# Patient Record
Sex: Male | Born: 1995 | Race: White | Hispanic: No | Marital: Single | Smoking: Current every day smoker
Health system: Southern US, Community
[De-identification: ages and names within clinical notes are randomized; demographics above are authoritative.]

## PROBLEM LIST (undated history)

## (undated) DIAGNOSIS — E119 Type 2 diabetes mellitus without complications: Secondary | ICD-10-CM

---
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
## 2019-01-12 ENCOUNTER — Other Ambulatory Visit: Payer: Self-pay

## 2019-01-12 ENCOUNTER — Emergency Department
Admission: EM | Admit: 2019-01-12 | Discharge: 2019-01-12 | Disposition: A | Discharge status: Home / Self Care | Payer: Self-pay | Attending: Emergency Medicine | Admitting: Emergency Medicine

## 2019-01-12 ENCOUNTER — Encounter: Payer: Self-pay | Admitting: Emergency Medicine

## 2019-01-12 DIAGNOSIS — L03311 Cellulitis of abdominal wall: Secondary | ICD-10-CM | POA: Insufficient documentation

## 2019-01-12 DIAGNOSIS — E109 Type 1 diabetes mellitus without complications: Secondary | ICD-10-CM | POA: Insufficient documentation

## 2019-01-12 HISTORY — DX: Type 2 diabetes mellitus without complications: E11.9

## 2019-01-12 MED ORDER — DOXYCYCLINE HYCLATE 100 MG PO CAPS: 100.0000 mg | ORAL_CAPSULE | Freq: Two times a day (BID) | ORAL | 0 refills | Status: AC

## 2019-01-12 MED ORDER — CEPHALEXIN 500 MG PO CAPS: 500.0000 mg | ORAL_CAPSULE | Freq: Three times a day (TID) | ORAL | 0 refills | Status: AC

## 2019-01-12 NOTE — ED Notes (Signed)
Assessment of abd and rash completed by Dr. Darnelle Catalan

## 2019-01-12 NOTE — ED Triage Notes (Signed)
Here for swelling to umbilical area for 5 days. + redness and pain per pt. No fevers. Has not lifted anything heavy. Appears like abscess.

## 2019-01-12 NOTE — ED Provider Notes (Signed)
[facility] Emergency Department Provider Note   ____________________________________________   First MD Initiated Contact with Patient 01/12/19 [phone]     (approximate)  I have reviewed the triage vital signs and the nursing notes.   HISTORY  Chief Complaint swelling around umbilical  HPI Patrick Castro is a 23 y.o. male patient complains of a red swollen area at the belt line just to the right and below the umbilicus.  Says it started out as a hair bump and got worse.  It is now red about the size of a quarter and tender.  Somewhat swollen.  Patient is a type I diabetic.  His sugars been running 200 which is high for him.         Past Medical History:  Diagnosis Date  . Diabetes mellitus without complication (HCC)     There are no active problems to display for this patient.   History reviewed. No pertinent surgical history.  Prior to Admission medications   Medication Sig Start Date End Date Taking? Authorizing Provider  cephALEXin (KEFLEX) 500 MG capsule Take 1 capsule (500 mg total) by mouth 3 (three) times daily for 10 days. 01/12/19 01/22/19  Arnaldo Natal, MD  doxycycline (VIBRAMYCIN) 100 MG capsule Take 1 capsule (100 mg total) by mouth 2 (two) times daily. 01/12/19   Arnaldo Natal, MD    Allergies Patient has no known allergies.  History reviewed. No pertinent family history.  Social History Social History   Tobacco Use  . Smoking status: Never Smoker  . Smokeless tobacco: Never Used  Substance Use Topics  . Alcohol use: Yes    Comment: rare  . Drug use: Never    Review of Systems  Constitutional: No fever/chills Eyes: No visual changes. ENT: No sore throat. Cardiovascular: Denies chest pain. Respiratory: Denies shortness of breath. Gastrointestinal: No abdominal pain.  No nausea, no vomiting.  No diarrhea.  No constipation. Genitourinary: Negative for dysuria. Musculoskeletal: Negative for back pain. Skin:  Negative for rash. Neurological: Negative for headaches, focal weakness   ____________________________________________   PHYSICAL EXAM:  VITAL SIGNS: ED Triage Vitals  Enc Vitals Group     BP 01/12/19 0752 (!) 138/96     Pulse Rate 01/12/19 0752 87     Resp --      Temp 01/12/19 0753 98.1 F (36.7 C)     Temp Source 01/12/19 0753 Oral     SpO2 01/12/19 0752 99 %     Weight 01/12/19 0748 190 lb (86.2 kg)     Height 01/12/19 0748 6\' 1"  (1.854 m)     Head Circumference --      Peak Flow --      Pain Score 01/12/19 0748 5     Pain Loc --      Pain Edu? --      Excl. in [place]? --     Constitutional: Alert and oriented. Well appearing and in no acute distress. Eyes: Conjunctivae are normal. Head: Atraumatic. Nose: No congestion/rhinnorhea. Mouth/Throat: Mucous membranes are moist.  Oropharynx non-erythematous. Neck: No stridor. Cardiovascular: Normal rate, regular rhythm. Grossly normal heart sounds.  Good peripheral circulation. Respiratory: Normal respiratory effort.  No retractions. Lungs CTAB. Gastrointestinal: Soft and nontender. No distention. No abdominal bruits. No CVA tenderness. Musculoskeletal: No lower extremity tenderness nor edema.  s. Neurologic:  Normal speech and language. No gross focal neurologic deficits are appreciated. No gait instability. Skin:  Skin is warm, dry and intact. No rash  noted except as described in HPI.. Psychiatric: Mood and affecMarland Kitchent are normal. Speech and behavior are normal.  ____________________________________________   LABS (all labs ordered are listed, but only abnormal results are displayed)  Labs Reviewed - No data to display ____________________________________________  EKG   ____________________________________________  RADIOLOGY  ED MD interpretation: Bedside ultrasound done of the red area with high-frequency vascular probe.  No pus is seen on compression of the area.  Official radiology report(s): No results found.   ____________________________________________   PROCEDURES  Procedure(s) performed (including Critical Care):  Procedures   ____________________________________________   INITIAL IMPRESSION / ASSESSMENT AND PLAN / ED COURSE  This appears to be area of cellulitis and/or phlegmon but not abscess at this point.  Will treat with Keflex and doxycycline to try and get this under control and avoid any resistance if it is a staph infection.  Patient will return if worse or no better in 2 or 3 days.              ____________________________________________   FINAL CLINICAL IMPRESSION(S) / ED DIAGNOSES  Final diagnoses:  Cellulitis of abdominal wall     ED Discharge Orders         Ordered    doxycycline (VIBRAMYCIN) 100 MG capsule  2 times daily     01/12/19 0821    cephALEXin (KEFLEX) 500 MG capsule  3 times daily     01/12/19 [id number]           Note:  This document was prepared using Dragon voice recognition software and may include unintentional dictation errors.    Arnaldo NatalMalinda, Quinne Pires F, MD 01/12/19 [phone]

## 2019-01-12 NOTE — Discharge Instructions (Addendum)
You have a little skin infection.  Sometimes these are caused by resistant bacteria.  Because of this I will give you 2 different antibiotics one is Keflex take that 3 times a day.  The otherwise doxycycline which you take twice a day.  Please return if this gets worse including swells up larger gets red or bigger or more painful or if it is not any better in 2 or 3 days.  It is still possible it may develop an abscess that we need to drain but right now there does not appear to be one there.  Monitor your sugars carefully.  They should improve as the infection improves.

## 2019-08-24 ENCOUNTER — Encounter: Payer: Self-pay | Admitting: Intensive Care

## 2019-08-24 ENCOUNTER — Other Ambulatory Visit: Payer: Self-pay

## 2019-08-24 ENCOUNTER — Emergency Department: Payer: Self-pay

## 2019-08-24 ENCOUNTER — Emergency Department
Admission: EM | Admit: 2019-08-24 | Discharge: 2019-08-24 | Disposition: A | Discharge status: Home / Self Care | Payer: Self-pay | Attending: Emergency Medicine | Admitting: Emergency Medicine

## 2019-08-24 DIAGNOSIS — M79661 Pain in right lower leg: Secondary | ICD-10-CM

## 2019-08-24 DIAGNOSIS — F17210 Nicotine dependence, cigarettes, uncomplicated: Secondary | ICD-10-CM | POA: Insufficient documentation

## 2019-08-24 DIAGNOSIS — E119 Type 2 diabetes mellitus without complications: Secondary | ICD-10-CM | POA: Insufficient documentation

## 2019-08-24 DIAGNOSIS — I82451 Acute embolism and thrombosis of right peroneal vein: Secondary | ICD-10-CM | POA: Insufficient documentation

## 2019-08-24 DIAGNOSIS — F17290 Nicotine dependence, other tobacco product, uncomplicated: Secondary | ICD-10-CM | POA: Insufficient documentation

## 2019-08-24 MED ORDER — APIXABAN 5 MG PO TABS: 10.0000 mg | ORAL_TABLET | Freq: Two times a day (BID) | ORAL | 0 refills | Status: AC

## 2019-08-24 MED ORDER — APIXABAN 5 MG PO TABS
10.0000 mg | ORAL_TABLET | Freq: Once | ORAL | Status: AC
  Administered 2019-08-24: 15:00:00 10 mg via ORAL
  Filled 2019-08-24: qty 2

## 2019-08-24 NOTE — ED Notes (Signed)
See triage note  Presents with pain to right heel and it radiates into lateral aspect of lower leg  Denies any injury   No swelling

## 2019-08-24 NOTE — ED Triage Notes (Signed)
Reports pain in his right calf X3-4 days. Denies injury. Able to ambulate with extreme pain

## 2019-08-24 NOTE — ED Notes (Signed)
Pt states he feels safe driving himself home, pt encouraged to call ride due to leg, pt refused. Pt steady on crutches.

## 2019-08-24 NOTE — ED Notes (Signed)
Pt given crutches and instructed on use. Pt discharged to lobby in wheelchair

## 2019-08-24 NOTE — ED Provider Notes (Signed)
[facility] Provider Note ____________________________________________  Time seen: 1140  I have reviewed the triage vital signs and the nursing notes.  HISTORY  Chief Complaint  Leg Pain (right calf)   HPI Patrick Castro Patrick Castro is a 23 y.o. male presents to the ER today with complaint of right lower leg pain.  He reports this started abruptly 4 to 5 days ago while sitting in class.  He describes the pain as throbbing at rest or when lying in the bed.  The pain is sharp with ambulation.  He has not noticed any redness or swelling of the right lower extremity.  He denies any injury to the area.  He reports his mother has a history of blood clots but she also had cancer so they were felt to be provoked.  He has taken buprofen OTC with minimal relief.  Past Medical History:  Diagnosis Date  . Diabetes mellitus without complication ([place])     There are no active problems to display for this patient.   History reviewed. No pertinent surgical history.  Prior to Admission medications   Medication Sig Start Date End Date Taking? Authorizing Provider  apixaban (ELIQUIS) 5 MG TABS tablet Take 2 tablets (10 mg total) by mouth 2 (two) times daily. 08/24/19   Jearld Fenton, NP  doxycycline (VIBRAMYCIN) 100 MG capsule Take 1 capsule (100 mg total) by mouth 2 (two) times daily. 01/12/19   Nena Polio, MD    Allergies Patient has no known allergies.  History reviewed. No pertinent family history.  Social History Social History   Tobacco Use  . Smoking status: Current Every Day Smoker    Types: E-cigarettes  . Smokeless tobacco: Never Used  Substance Use Topics  . Alcohol use: Yes    Comment: rare  . Drug use: Never    Review of Systems  Constitutional: Negative for fever, chills or body aches. Cardiovascular: Negative for chest pain or chest tightness. Respiratory: Negative for cough or shortness of breath. Musculoskeletal:  Positive for right lower extremity pain Skin: Negative for redness, warmth or swelling. Neurological: Negative for focal weakness, tingling or numbness. ____________________________________________  PHYSICAL EXAM:  VITAL SIGNS: ED Triage Vitals  Enc Vitals Group     BP 08/24/19 1113 133/89     Pulse Rate 08/24/19 1113 (!) 115     Resp 08/24/19 1113 14     Temp 08/24/19 1113 99.5 F (37.5 C)     Temp Source 08/24/19 1113 Oral     SpO2 08/24/19 1113 96 %     Weight 08/24/19 1111 195 lb (88.5 kg)     Height 08/24/19 1111 6\' 1"  (1.854 m)     Head Circumference --      Peak Flow --      Pain Score 08/24/19 1110 2     Pain Loc --      Pain Edu? --      Excl. in [place]? --     Constitutional: Alert and oriented. Well appearing and in no distress. Cardiovascular: Normal rate, regular rhythm.  Pedal pulses 2+ bilaterally.  Negative Homans. Respiratory: Normal respiratory effort. No wheezes/rales/rhonchi. Musculoskeletal: Normal flexion and extension of the right knee.  No pain with palpation of the knee.  Normal dorsiflexion, plantar flexion and rotation of the right ankle.  No pain with palpation of the right ankle.  Pain with palpation over the right fibula.  No right lower extremity swelling noted.  Gait not visualized. Neurologic:  Normal speech and language. No gross focal neurologic deficits are appreciated. Skin:  Skin is warm, dry and intact. No redness, warmth or swelling noted.  ____________________________________________   RADIOLOGY  Imaging Orders     DG Tibia/Fibula Right     US Venous Img Lower Unilateral Right   IMPRESSION:  Negative exam.   IMPRESSION:  1. POSITIVE for isolated right calf (peroneal) DVT.    ____________________________________________   INITIAL IMPRESSION / ASSESSMENT AND PLAN / ED COURSE  Right Lower Extremity Pain:   Xray right tib/fib negative for fracture Venous U/S RLE positive for DVT Eliquis 10 mg PO x 1 in ER RX for Eliquis 10 mg  PO x 7 days Recommend follow up with [facility] Castro on Monday Recommend follow up with hematology as outpatient  ____________________________________________  FINAL CLINICAL IMPRESSION(S) / ED DIAGNOSES  Final diagnoses:  Acute deep vein thrombosis (DVT) of right peroneal vein Patrick [facility])   Nicki Reaper, NP    Lorre Munroe, NP 08/24/19 1459    Concha Se, MD 08/26/19 [phone]

## 2019-08-24 NOTE — Discharge Instructions (Addendum)
You were seen today for acute right lower leg pain.  Your x-ray was negative.  Your ultrasound was positive for peroneal DVT (blood clot).  We have given you your first dose of anticoagulation today in the ER.  I have given you prescription for your anticoagulation through Monday evening.  Please take 2 tabs in the morning and 2 tabs in the evening until you follow-up with Princella Ion on Monday.  Monitor for signs and symptoms of bleeding.  Your PCP will set you up with a hematologist.

## 2019-10-09 ENCOUNTER — Ambulatory Visit: Payer: Self-pay | Attending: Internal Medicine

## 2019-10-09 DIAGNOSIS — Z20822 Contact with and (suspected) exposure to covid-19: Secondary | ICD-10-CM | POA: Insufficient documentation

## 2019-10-10 LAB — NOVEL CORONAVIRUS, NAA: SARS-CoV-2, NAA: NOT DETECTED

## 2019-12-20 ENCOUNTER — Telehealth: Payer: Self-pay | Admitting: Pharmacy Technician

## 2019-12-20 NOTE — Telephone Encounter (Signed)
Patient failed to provide 2021 proof of income.  No additional medication assistance will be provided by [facility] without the required proof of income documentation.  Patient notified by letter.  [facility] Care Manager [facility]   [address] [place], [place]  [address]     This is to inform you that you are no longer eligible to receive medication assistance at [facility].  The reason(s) are:    _____Your total gross monthly household income exceeds 250% of the Federal Poverty Level.   _____Tangible assets (savings, checking, stocks/bonds, pension, retirement, etc.) exceeds our limit  _____You are eligible to receive benefits from Medicaid, [facility] or HIV Medication              Assistance Program _____You are eligible to receive benefits from a Medicare Part "D" plan _____You have prescription insurance  _____You are not an [place] resident __X__Failure to provide all requested proof of income information for 2021.    Medication assistance will resume once all requested financial information has been returned to our clinic.  If you have questions, please contact our clinic at [phone].    Thank you,  [facility] 

## 2019-12-30 ENCOUNTER — Other Ambulatory Visit: Payer: Self-pay

## 2020-06-08 IMAGING — US US EXTREM LOW VENOUS*R*
1 series · 14 of 24 positions shown · non-contrast
Comparison: None available

CLINICAL DATA: Pain, acute

EXAM:
RIGHT LOWER EXTREMITY VENOUS DOPPLER ULTRASOUND
TECHNIQUE: Gray-scale sonography with compression, as well as color and duplex
ultrasound, were performed to evaluate the deep venous system from
the level of the common femoral vein through the popliteal and
proximal calf veins.

[Series 1: us extrem low venous*right* · 14 of 34 slices shown]
[im 1/34]
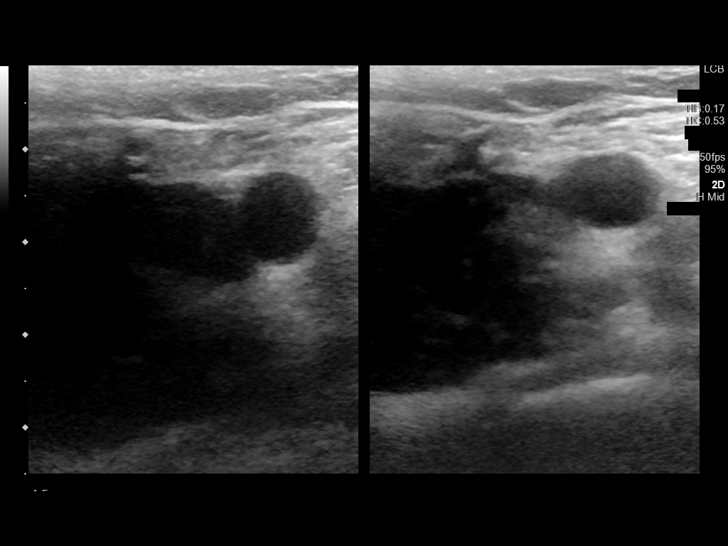
[im 3/34]
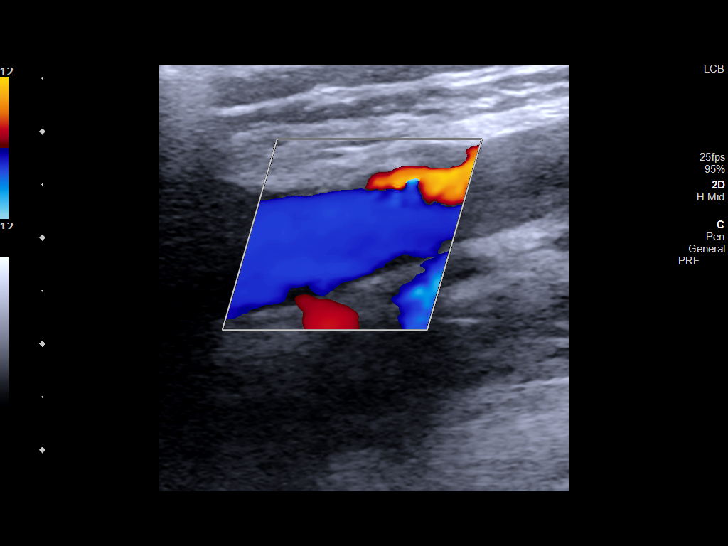
[im 6/34]
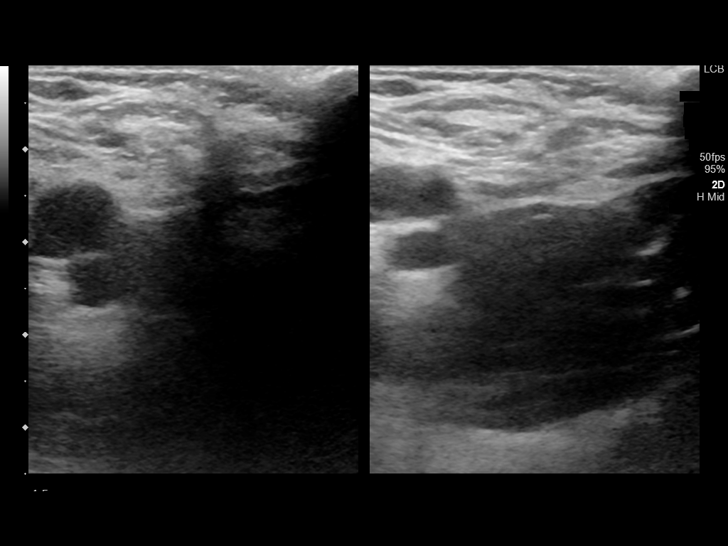
[im 9/34]
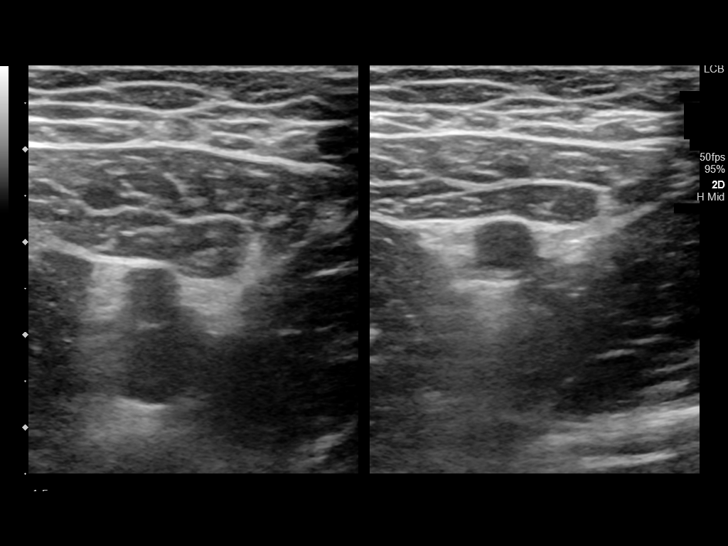
[im 11/34]
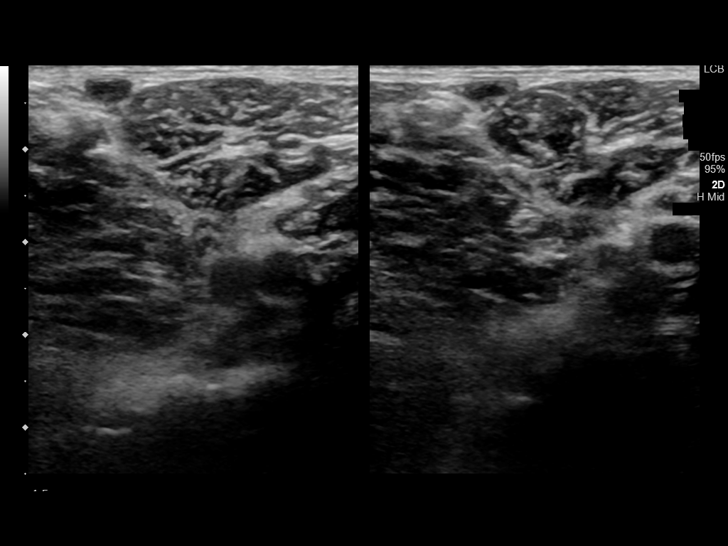
[im 13/34]
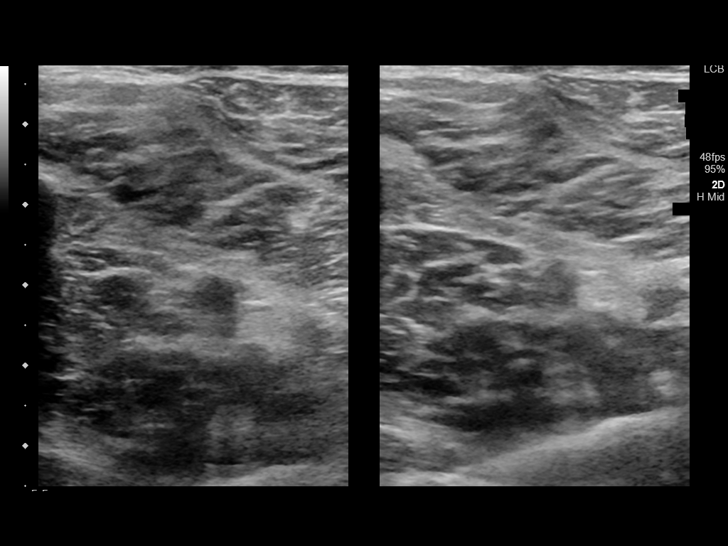
[im 16/34]
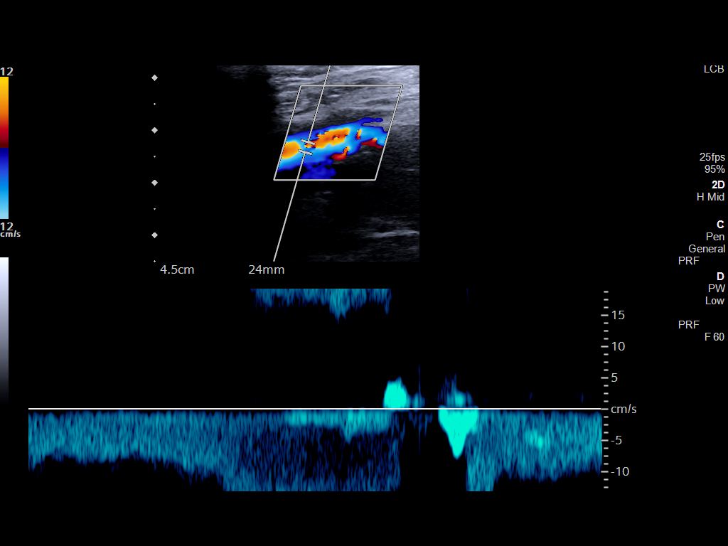
[im 18/34]
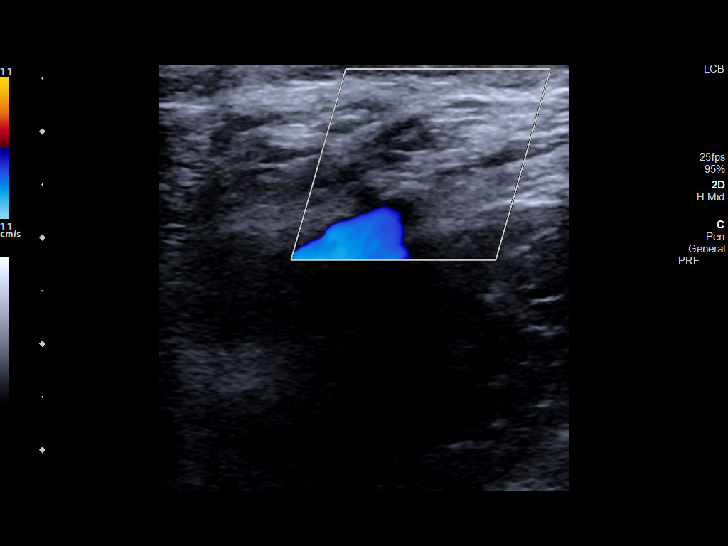
[im 21/34]
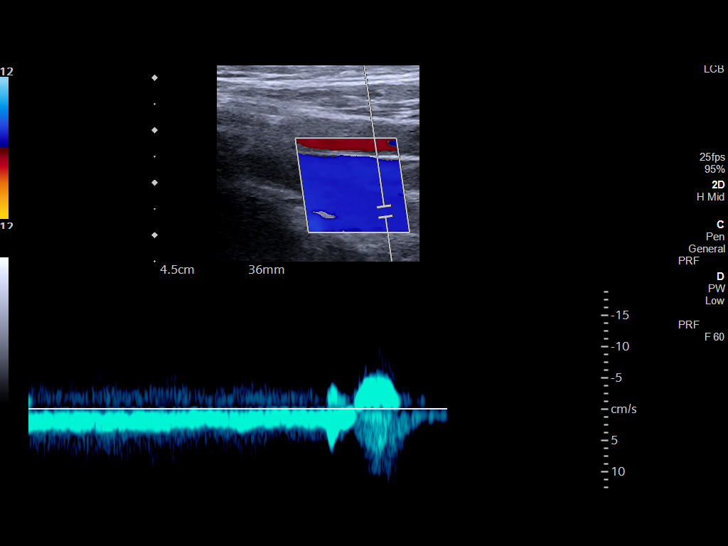
[im 23/34]
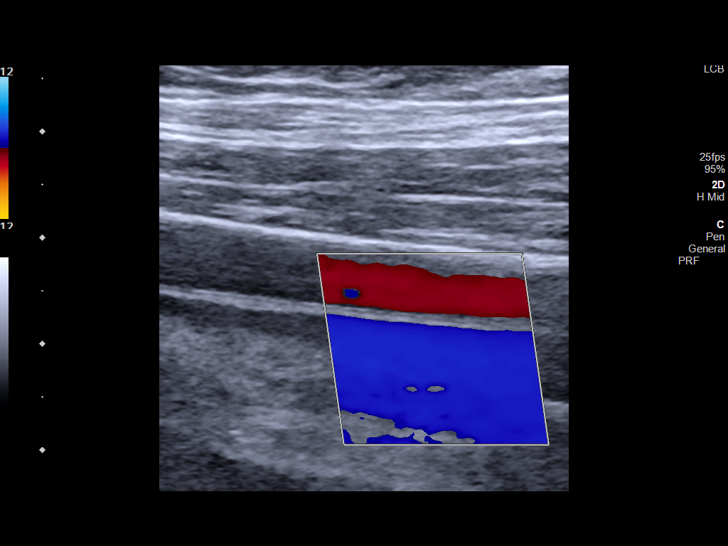
[im 26/34]
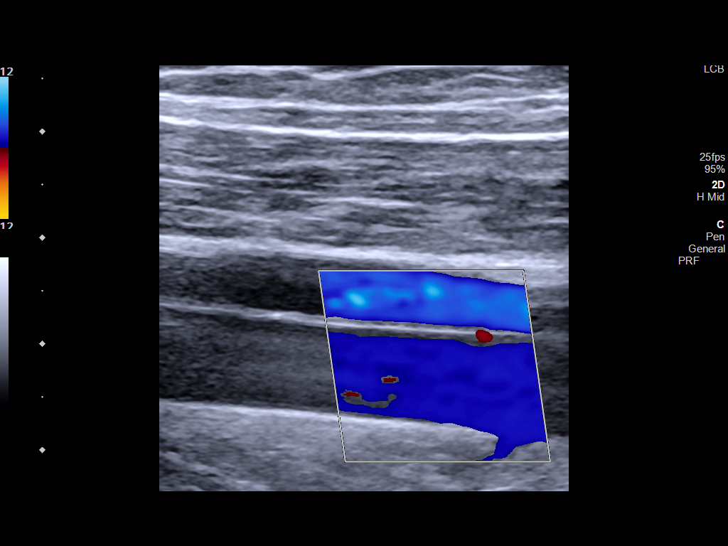
[im 28/34]
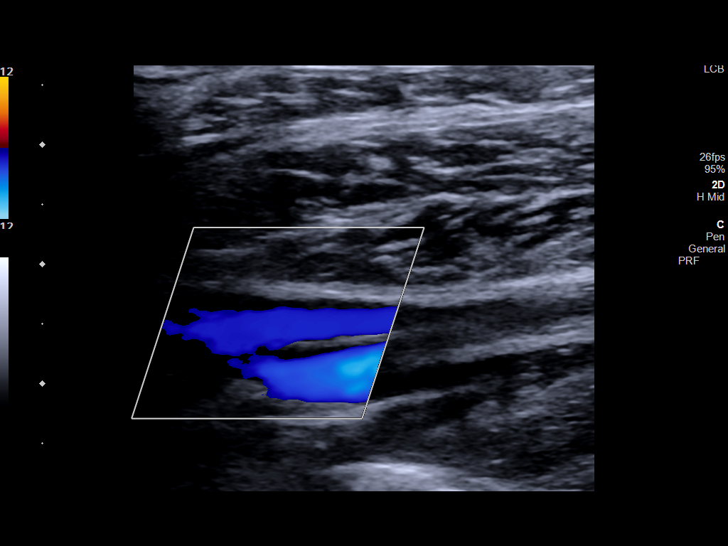
[im 31/34]
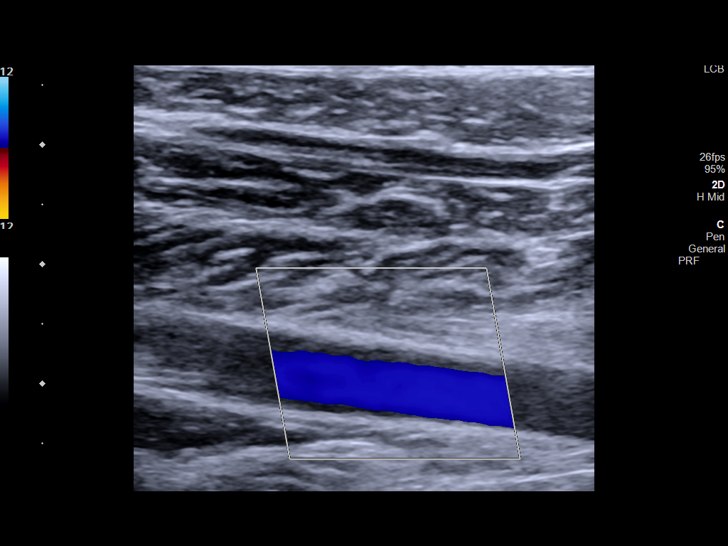
[im 34/34]
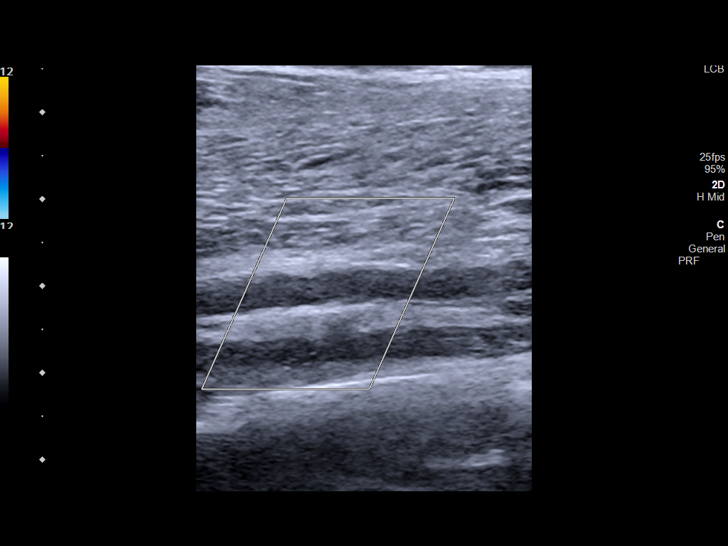

[14 of 24 positions shown; findings below may reference images not displayed]

FINDINGS: Normal compressibility of the common femoral, superficial femoral,
and popliteal veins. Occlusive noncompressible hypoechoic thrombus
in the right peroneal vein. Visualized posterior tibial veins are
normal.

Survey views of the contralateral common femoral vein are
unremarkable.
IMPRESSION: 1. POSITIVE for isolated right calf (peroneal) DVT.
